# Patient Record
Sex: Female | Born: 1990 | Race: Black or African American | Hispanic: No | Marital: Single | Smoking: Current some day smoker
Health system: Southern US, Community
[De-identification: ages and names within clinical notes are randomized; demographics above are authoritative.]

## PROBLEM LIST (undated history)

## (undated) DIAGNOSIS — D759 Disease of blood and blood-forming organs, unspecified: Secondary | ICD-10-CM

---
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
## 2009-02-02 ENCOUNTER — Emergency Department: Payer: Self-pay | Admitting: Emergency Medicine

## 2010-01-10 ENCOUNTER — Emergency Department: Payer: Self-pay | Admitting: Emergency Medicine

## 2010-05-04 ENCOUNTER — Emergency Department: Payer: Self-pay | Admitting: Emergency Medicine

## 2010-11-13 ENCOUNTER — Emergency Department: Payer: Self-pay | Admitting: *Deleted

## 2010-11-14 ENCOUNTER — Observation Stay: Payer: Self-pay

## 2010-12-09 ENCOUNTER — Observation Stay: Payer: Self-pay | Admitting: Obstetrics and Gynecology

## 2011-01-10 ENCOUNTER — Emergency Department (HOSPITAL_COMMUNITY)
Admission: EM | Admit: 2011-01-10 | Discharge: 2011-01-10 | Disposition: A | Discharge status: Home / Self Care | Payer: Medicaid Other | Attending: Emergency Medicine | Admitting: Emergency Medicine

## 2011-01-10 ENCOUNTER — Encounter: Payer: Self-pay | Admitting: *Deleted

## 2011-01-10 ENCOUNTER — Emergency Department (HOSPITAL_COMMUNITY): Payer: Medicaid Other

## 2011-01-10 DIAGNOSIS — O99891 Other specified diseases and conditions complicating pregnancy: Secondary | ICD-10-CM | POA: Insufficient documentation

## 2011-01-10 DIAGNOSIS — R52 Pain, unspecified: Secondary | ICD-10-CM | POA: Insufficient documentation

## 2011-01-10 DIAGNOSIS — O0990 Supervision of high risk pregnancy, unspecified, unspecified trimester: Secondary | ICD-10-CM

## 2011-01-10 LAB — TYPE AND SCREEN
ABO/RH(D): O POS
Antibody Screen: NEGATIVE

## 2011-01-10 LAB — CBC
MCV: 95.8 fL (ref 78.0–100.0)
Platelets: 223 10*3/uL (ref 150–400)
RBC: 2.89 MIL/uL — ABNORMAL LOW (ref 3.87–5.11)
RDW: 19.5 % — ABNORMAL HIGH (ref 11.5–15.5)
WBC: 10.7 10*3/uL — ABNORMAL HIGH (ref 4.0–10.5)
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
## 2011-01-10 NOTE — ED Notes (Signed)
Placed call to [facility] for transportation to [facility]

## 2011-01-10 NOTE — ED Notes (Signed)
Pt to xray.  OB RN at bedside, pt placed on OB portable monitor.  Pt stable.

## 2011-01-10 NOTE — ED Notes (Signed)
OB RR remains at bedside.  Pt currently concerned about not having a ride home after being [facility].  Pt talked to by EDP, OB RR and myself.  Pt continues to want to leave AMA.  Aware of risks.

## 2011-01-11 LAB — ABO/RH: ABO/RH(D): O POS

## 2011-01-11 NOTE — ED Provider Notes (Signed)
I saw and evaluated the patient, reviewed the resident's note and I agree with the findings and plan.   Dione Booze, MD 01/11/11 2330

## 2011-01-12 ENCOUNTER — Encounter (HOSPITAL_COMMUNITY): Payer: Self-pay

## 2011-01-12 ENCOUNTER — Emergency Department (HOSPITAL_COMMUNITY)
Admission: EM | Admit: 2011-01-12 | Discharge: 2011-01-12 | Discharge status: Against Medical Advice | Payer: Self-pay | Attending: Emergency Medicine | Admitting: Emergency Medicine

## 2011-01-12 DIAGNOSIS — Z0389 Encounter for observation for other suspected diseases and conditions ruled out: Secondary | ICD-10-CM | POA: Insufficient documentation

## 2011-01-12 HISTORY — DX: Disease of blood and blood-forming organs, unspecified: D75.9

## 2011-01-12 NOTE — ED Notes (Signed)
Pt left after triage
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
## 2011-01-13 ENCOUNTER — Observation Stay: Payer: Self-pay | Admitting: Emergency Medicine

## 2011-04-26 ENCOUNTER — Observation Stay: Payer: Self-pay | Admitting: Obstetrics and Gynecology

## 2011-04-26 LAB — URINALYSIS, COMPLETE
Blood: NEGATIVE
Nitrite: NEGATIVE
Protein: NEGATIVE
Specific Gravity: 1.023 (ref 1.003–1.030)

## 2011-04-27 LAB — URINE CULTURE

## 2012-05-04 ENCOUNTER — Emergency Department: Payer: Self-pay | Admitting: Emergency Medicine

## 2012-05-04 LAB — CBC
HCT: 30.3 % — ABNORMAL LOW (ref 35.0–47.0)
HGB: 11.2 g/dL — ABNORMAL LOW (ref 12.0–16.0)
MCHC: 36.9 g/dL — ABNORMAL HIGH (ref 32.0–36.0)
RBC: 3.32 10*6/uL — ABNORMAL LOW (ref 3.80–5.20)
RDW: 21 % — ABNORMAL HIGH (ref 11.5–14.5)

## 2012-05-04 LAB — HCG, QUANTITATIVE, PREGNANCY: Beta Hcg, Quant.: 3666 m[IU]/mL — ABNORMAL HIGH

## 2013-09-22 ENCOUNTER — Emergency Department: Payer: Self-pay | Admitting: Emergency Medicine

## 2013-09-22 LAB — COMPREHENSIVE METABOLIC PANEL
ALBUMIN: 3.7 g/dL (ref 3.4–5.0)
ANION GAP: 15 (ref 7–16)
Alkaline Phosphatase: 48 U/L
BUN: 5 mg/dL — AB (ref 7–18)
Bilirubin,Total: 1 mg/dL (ref 0.2–1.0)
CALCIUM: 8.2 mg/dL — AB (ref 8.5–10.1)
CHLORIDE: 108 mmol/L — AB (ref 98–107)
CO2: 18 mmol/L — AB (ref 21–32)
CREATININE: 0.93 mg/dL (ref 0.60–1.30)
EGFR (African American): >60
GLUCOSE: 101 mg/dL — AB (ref 65–99)
Osmolality: 279 (ref 275–301)
Potassium: 3.5 mmol/L (ref 3.5–5.1)
SGOT(AST): 19 U/L (ref 15–37)
SGPT (ALT): 15 U/L
SODIUM: 141 mmol/L (ref 136–145)
TOTAL PROTEIN: 7.9 g/dL (ref 6.4–8.2)

## 2013-09-22 LAB — CBC WITH DIFFERENTIAL/PLATELET
Basophil #: 0 10*3/uL (ref 0.0–0.1)
Basophil %: 0.4 %
Eosinophil #: 0 10*3/uL (ref 0.0–0.7)
Eosinophil %: 0.1 %
HCT: 31.7 % — ABNORMAL LOW (ref 35.0–47.0)
HGB: 11.1 g/dL — ABNORMAL LOW (ref 12.0–16.0)
Lymphocyte #: 1.2 10*3/uL (ref 1.0–3.6)
Lymphocyte %: 10.4 %
MCH: 32.7 pg (ref 26.0–34.0)
MCHC: 34.9 g/dL (ref 32.0–36.0)
MCV: 94 fL (ref 80–100)
Monocyte #: 0.5 x10 3/mm (ref 0.2–0.9)
Monocyte %: 4.8 %
Neutrophil #: 9.5 10*3/uL — ABNORMAL HIGH (ref 1.4–6.5)
Neutrophil %: 84.3 %
Platelet: 324 10*3/uL (ref 150–440)
RBC: 3.38 10*6/uL — ABNORMAL LOW (ref 3.80–5.20)
RDW: 19.7 % — ABNORMAL HIGH (ref 11.5–14.5)
WBC: 11.3 10*3/uL — ABNORMAL HIGH (ref 3.6–11.0)

## 2013-09-22 LAB — URINALYSIS, COMPLETE
Bacteria: NONE SEEN
Bilirubin,UR: NEGATIVE
Blood: NEGATIVE
Glucose,UR: NEGATIVE mg/dL (ref 0–75)
Ketone: NEGATIVE
Leukocyte Esterase: NEGATIVE
Nitrite: NEGATIVE
Ph: 5 (ref 4.5–8.0)
Protein: NEGATIVE
RBC,UR: 2 /HPF (ref 0–5)
Specific Gravity: 1.017 (ref 1.003–1.030)
Squamous Epithelial: 1
WBC UR: 1 /HPF (ref 0–5)

## 2013-09-22 LAB — TROPONIN I: Troponin-I: <0.02 ng/mL

## 2013-09-22 LAB — LIPASE, BLOOD: Lipase: 56 U/L — ABNORMAL LOW (ref 73–393)

## 2013-12-09 ENCOUNTER — Encounter (HOSPITAL_COMMUNITY): Payer: Self-pay

## 2014-02-18 ENCOUNTER — Emergency Department: Payer: Self-pay | Admitting: Emergency Medicine

## 2014-03-23 ENCOUNTER — Emergency Department: Payer: Self-pay | Admitting: Student

## 2014-04-19 ENCOUNTER — Emergency Department: Payer: Self-pay | Admitting: Emergency Medicine

## 2014-07-11 ENCOUNTER — Emergency Department (HOSPITAL_COMMUNITY): Payer: Medicaid Other | Admitting: Anesthesiology

## 2014-07-11 ENCOUNTER — Encounter
Admission: EM | Disposition: A | Discharge status: Home / Self Care | Payer: Self-pay | Source: Home / Self Care | Attending: Emergency Medicine

## 2014-07-11 ENCOUNTER — Encounter (HOSPITAL_COMMUNITY): Payer: Self-pay | Admitting: Anesthesiology

## 2014-07-11 ENCOUNTER — Emergency Department
Admission: EM | Admit: 2014-07-11 | Discharge: 2014-07-11 | Disposition: A | Discharge status: Home / Self Care | Payer: Worker's Compensation | Attending: Emergency Medicine | Admitting: Emergency Medicine

## 2014-07-11 ENCOUNTER — Encounter: Payer: Self-pay | Admitting: Emergency Medicine

## 2014-07-11 ENCOUNTER — Encounter (HOSPITAL_COMMUNITY): Payer: Self-pay

## 2014-07-11 ENCOUNTER — Emergency Department: Payer: Worker's Compensation

## 2014-07-11 ENCOUNTER — Encounter (HOSPITAL_COMMUNITY)
Admission: EM | Disposition: A | Discharge status: Home / Self Care | Payer: Self-pay | Source: Home / Self Care | Attending: Emergency Medicine

## 2014-07-11 ENCOUNTER — Ambulatory Visit (HOSPITAL_COMMUNITY)
Admission: EM | Admit: 2014-07-11 | Discharge: 2014-07-12 | Disposition: A | Discharge status: Home / Self Care | Payer: Medicaid Other | Attending: Emergency Medicine | Admitting: Emergency Medicine

## 2014-07-11 DIAGNOSIS — S6991XA Unspecified injury of right wrist, hand and finger(s), initial encounter: Secondary | ICD-10-CM | POA: Diagnosis present

## 2014-07-11 DIAGNOSIS — Y990 Civilian activity done for income or pay: Secondary | ICD-10-CM | POA: Diagnosis not present

## 2014-07-11 DIAGNOSIS — S62501B Fracture of unspecified phalanx of right thumb, initial encounter for open fracture: Secondary | ICD-10-CM | POA: Diagnosis not present

## 2014-07-11 DIAGNOSIS — Z79899 Other long term (current) drug therapy: Secondary | ICD-10-CM | POA: Insufficient documentation

## 2014-07-11 DIAGNOSIS — F17210 Nicotine dependence, cigarettes, uncomplicated: Secondary | ICD-10-CM | POA: Insufficient documentation

## 2014-07-11 DIAGNOSIS — S61011A Laceration without foreign body of right thumb without damage to nail, initial encounter: Secondary | ICD-10-CM | POA: Diagnosis not present

## 2014-07-11 DIAGNOSIS — Y9289 Other specified places as the place of occurrence of the external cause: Secondary | ICD-10-CM | POA: Insufficient documentation

## 2014-07-11 DIAGNOSIS — S66221A Laceration of extensor muscle, fascia and tendon of right thumb at wrist and hand level, initial encounter: Secondary | ICD-10-CM | POA: Diagnosis not present

## 2014-07-11 DIAGNOSIS — IMO0002 Reserved for concepts with insufficient information to code with codable children: Secondary | ICD-10-CM

## 2014-07-11 DIAGNOSIS — S61209A Unspecified open wound of unspecified finger without damage to nail, initial encounter: Secondary | ICD-10-CM

## 2014-07-11 DIAGNOSIS — Y93G3 Activity, cooking and baking: Secondary | ICD-10-CM | POA: Insufficient documentation

## 2014-07-11 DIAGNOSIS — Z720 Tobacco use: Secondary | ICD-10-CM | POA: Diagnosis not present

## 2014-07-11 DIAGNOSIS — S56429A Laceration of extensor muscle, fascia and tendon of unspecified finger at forearm level, initial encounter: Secondary | ICD-10-CM

## 2014-07-11 DIAGNOSIS — Y93G9 Activity, other involving cooking and grilling: Secondary | ICD-10-CM | POA: Insufficient documentation

## 2014-07-11 DIAGNOSIS — W290XXA Contact with powered kitchen appliance, initial encounter: Secondary | ICD-10-CM | POA: Insufficient documentation

## 2014-07-11 DIAGNOSIS — S63621A Sprain of interphalangeal joint of right thumb, initial encounter: Secondary | ICD-10-CM | POA: Insufficient documentation

## 2014-07-11 DIAGNOSIS — Z23 Encounter for immunization: Secondary | ICD-10-CM | POA: Insufficient documentation

## 2014-07-11 DIAGNOSIS — S61219A Laceration without foreign body of unspecified finger without damage to nail, initial encounter: Secondary | ICD-10-CM

## 2014-07-11 HISTORY — PX: I & D EXTREMITY: SHX5045

## 2014-07-11 SURGERY — IRRIGATION AND DEBRIDEMENT EXTREMITY
Anesthesia: General | Site: Thumb | Laterality: Right

## 2014-07-11 SURGERY — IRRIGATION AND DEBRIDEMENT EXTREMITY
Anesthesia: General | Laterality: Right

## 2014-07-11 MED ORDER — FENTANYL CITRATE (PF) 250 MCG/5ML IJ SOLN
INTRAMUSCULAR | Status: AC
  Filled 2014-07-11: qty 5

## 2014-07-11 MED ORDER — TETANUS-DIPHTH-ACELL PERTUSSIS 5-2.5-18.5 LF-MCG/0.5 IM SUSP
INTRAMUSCULAR | Status: AC
  Administered 2014-07-11: 0.5 mL via INTRAMUSCULAR
  Filled 2014-07-11: qty 0.5

## 2014-07-11 MED ORDER — TETANUS-DIPHTH-ACELL PERTUSSIS 5-2.5-18.5 LF-MCG/0.5 IM SUSP
0.5000 mL | Freq: Once | INTRAMUSCULAR | Status: AC
  Administered 2014-07-11: 0.5 mL via INTRAMUSCULAR

## 2014-07-11 MED ORDER — TETANUS-DIPHTHERIA TOXOIDS TD 5-2 LFU IM INJ: 0.5000 mL | INJECTION | Freq: Once | INTRAMUSCULAR | Status: DC

## 2014-07-11 MED ORDER — HYDROMORPHONE HCL 1 MG/ML IJ SOLN
INTRAMUSCULAR | Status: AC
  Filled 2014-07-11: qty 1

## 2014-07-11 MED ORDER — ONDANSETRON HCL 4 MG/2ML IJ SOLN: 4.0000 mg | Freq: Once | INTRAMUSCULAR | Status: DC

## 2014-07-11 MED ORDER — LIDOCAINE HCL (CARDIAC) 20 MG/ML IV SOLN
INTRAVENOUS | Status: AC
  Filled 2014-07-11: qty 5

## 2014-07-11 MED ORDER — TETANUS-DIPHTH-ACELL PERTUSSIS 5-2.5-18.5 LF-MCG/0.5 IM SUSP
INTRAMUSCULAR | Status: AC
  Filled 2014-07-11: qty 0.5

## 2014-07-11 MED ORDER — MORPHINE SULFATE 4 MG/ML IJ SOLN: 4.0000 mg | Freq: Once | INTRAMUSCULAR | Status: DC

## 2014-07-11 MED ORDER — HYDROMORPHONE HCL 1 MG/ML IJ SOLN
1.0000 mg | Freq: Once | INTRAMUSCULAR | Status: AC
  Administered 2014-07-11: 1 mg via INTRAMUSCULAR

## 2014-07-11 MED ORDER — MIDAZOLAM HCL 2 MG/2ML IJ SOLN
INTRAMUSCULAR | Status: AC
  Filled 2014-07-11: qty 2

## 2014-07-11 MED ORDER — HYDROMORPHONE HCL 1 MG/ML IJ SOLN
INTRAMUSCULAR | Status: AC
  Administered 2014-07-11: 1 mg via INTRAVENOUS
  Filled 2014-07-11: qty 1

## 2014-07-11 MED ORDER — HYDROMORPHONE HCL 1 MG/ML IJ SOLN
1.0000 mg | Freq: Once | INTRAMUSCULAR | Status: DC
  Administered 2014-07-11: 1 mg via INTRAVENOUS

## 2014-07-11 MED ORDER — CEFAZOLIN SODIUM-DEXTROSE 2-3 GM-% IV SOLR
2.0000 g | INTRAVENOUS | Status: DC
  Filled 2014-07-11: qty 50

## 2014-07-11 MED ORDER — BUPIVACAINE HCL (PF) 0.25 % IJ SOLN
INTRAMUSCULAR | Status: AC
  Filled 2014-07-11: qty 30

## 2014-07-11 MED ORDER — PROPOFOL 10 MG/ML IV BOLUS
INTRAVENOUS | Status: AC
  Filled 2014-07-11: qty 20

## 2014-07-11 MED ORDER — LIDOCAINE HCL (PF) 1 % IJ SOLN
INTRAMUSCULAR | Status: AC
  Filled 2014-07-11: qty 10

## 2014-07-11 SURGICAL SUPPLY — 45 items
BAG DECANTER FOR FLEXI CONT (MISCELLANEOUS) ×3 IMPLANT
BANDAGE ELASTIC 3 VELCRO ST LF (GAUZE/BANDAGES/DRESSINGS) IMPLANT
BANDAGE ELASTIC 4 VELCRO ST LF (GAUZE/BANDAGES/DRESSINGS) IMPLANT
BNDG COHESIVE 1X5 TAN STRL LF (GAUZE/BANDAGES/DRESSINGS) ×3 IMPLANT
BNDG CONFORM 2 STRL LF (GAUZE/BANDAGES/DRESSINGS) ×3 IMPLANT
BNDG ESMARK 4X9 LF (GAUZE/BANDAGES/DRESSINGS) ×3 IMPLANT
BNDG GAUZE ELAST 4 BULKY (GAUZE/BANDAGES/DRESSINGS) IMPLANT
CORDS BIPOLAR (ELECTRODE) IMPLANT
CUFF TOURNIQUET SINGLE 18IN (TOURNIQUET CUFF) IMPLANT
DRAPE SURG 17X23 STRL (DRAPES) ×3 IMPLANT
ELECT REM PT RETURN 9FT ADLT (ELECTROSURGICAL)
ELECTRODE REM PT RTRN 9FT ADLT (ELECTROSURGICAL) IMPLANT
GAUZE PACKING IODOFORM 1/4X5 (PACKING) IMPLANT
GAUZE SPONGE 4X4 12PLY STRL (GAUZE/BANDAGES/DRESSINGS) ×3 IMPLANT
GAUZE XEROFORM 1X8 LF (GAUZE/BANDAGES/DRESSINGS) ×3 IMPLANT
GLOVE BIOGEL M STRL SZ7.5 (GLOVE) ×3 IMPLANT
GOWN STRL REUS W/ TWL LRG LVL3 (GOWN DISPOSABLE) ×2 IMPLANT
GOWN STRL REUS W/TWL LRG LVL3 (GOWN DISPOSABLE) ×4
HANDPIECE INTERPULSE COAX TIP (DISPOSABLE)
KIT BASIN OR (CUSTOM PROCEDURE TRAY) ×3 IMPLANT
KIT ROOM TURNOVER OR (KITS) ×3 IMPLANT
MANIFOLD NEPTUNE II (INSTRUMENTS) ×3 IMPLANT
NEEDLE HYPO 25GX1X1/2 BEV (NEEDLE) IMPLANT
NS IRRIG 1000ML POUR BTL (IV SOLUTION) ×3 IMPLANT
PACK ORTHO EXTREMITY (CUSTOM PROCEDURE TRAY) ×3 IMPLANT
PAD ARMBOARD 7.5X6 YLW CONV (MISCELLANEOUS) ×6 IMPLANT
PAD CAST 4YDX4 CTTN HI CHSV (CAST SUPPLIES) IMPLANT
PADDING CAST COTTON 4X4 STRL (CAST SUPPLIES)
SET HNDPC FAN SPRY TIP SCT (DISPOSABLE) IMPLANT
SOAP 2 % CHG 4 OZ (WOUND CARE) ×3 IMPLANT
SPLINT FINGER W/BULB (SOFTGOODS) ×3 IMPLANT
SPONGE GAUZE 4X4 12PLY STER LF (GAUZE/BANDAGES/DRESSINGS) ×3 IMPLANT
SPONGE LAP 18X18 X RAY DECT (DISPOSABLE) IMPLANT
SPONGE LAP 4X18 X RAY DECT (DISPOSABLE) ×3 IMPLANT
SUT ETHILON 5 0 PS 2 18 (SUTURE) ×3 IMPLANT
SUT FIBERWIRE 4-0 18 TAPR NDL (SUTURE) ×3
SUTURE FIBERWR 4-0 18 TAPR NDL (SUTURE) ×1 IMPLANT
SYR CONTROL 10ML LL (SYRINGE) IMPLANT
TOWEL OR 17X24 6PK STRL BLUE (TOWEL DISPOSABLE) ×3 IMPLANT
TOWEL OR 17X26 10 PK STRL BLUE (TOWEL DISPOSABLE) ×3 IMPLANT
TUBE ANAEROBIC SPECIMEN COL (MISCELLANEOUS) IMPLANT
TUBE CONNECTING 12'X1/4 (SUCTIONS) ×1
TUBE CONNECTING 12X1/4 (SUCTIONS) ×2 IMPLANT
WATER STERILE IRR 1000ML POUR (IV SOLUTION) ×3 IMPLANT
YANKAUER SUCT BULB TIP NO VENT (SUCTIONS) ×3 IMPLANT

## 2014-07-11 NOTE — ED Notes (Signed)
Pt to short stay for surgery. Rose, MD at bedside to assume care. Report given.

## 2014-07-11 NOTE — ED Notes (Signed)
Pt complaining of laceration to R thumb. 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

## 2014-07-11 NOTE — Discharge Instructions (Signed)
Laceration Care, Adult A laceration is a cut that goes through all layers of the skin. The cut goes into the tissue beneath the skin. HOME CARE For stitches (sutures) or staples:  Keep the cut clean and dry.  If you have a bandage (dressing), change it at least once a day. Change the bandage if it gets wet or dirty, or as told by your doctor.  Wash the cut with soap and water 2 times a day. Rinse the cut with water. Pat it dry with a clean towel.  Put a thin layer of medicated cream on the cut as told by your doctor.  You may shower after the first 24 hours. Do not soak the cut in water until the stitches are removed.  Only take medicines as told by your doctor.  Have your stitches or staples removed as told by your doctor. For skin adhesive strips:  Keep the cut clean and dry.  Do not get the strips wet. You may take a bath, but be careful to keep the cut dry.  If the cut gets wet, pat it dry with a clean towel.  The strips will fall off on their own. Do not remove the strips that are still stuck to the cut. For wound glue:  You may shower or take baths. Do not soak or scrub the cut. Do not swim. Avoid heavy sweating until the glue falls off on its own. After a shower or bath, pat the cut dry with a clean towel.  Do not put medicine on your cut until the glue falls off.  If you have a bandage, do not put tape over the glue.  Avoid lots of sunlight or tanning lamps until the glue falls off. Put sunscreen on the cut for the first year to reduce your scar.  The glue will fall off on its own. Do not pick at the glue. You may need a tetanus shot if:  You cannot remember when you had your last tetanus shot.  You have never had a tetanus shot. If you need a tetanus shot and you choose not to have one, you may get tetanus. Sickness from tetanus can be serious. GET HELP RIGHT AWAY IF:   Your pain does not get better with medicine.  Your arm, hand, leg, or foot loses feeling  (numbness) or changes color.  Your cut is bleeding.  Your joint feels weak, or you cannot use your joint.  You have painful lumps on your body.  Your cut is red, puffy (swollen), or painful.  You have a red line on the skin near the cut.  You have yellowish-white fluid (pus) coming from the cut.  You have a fever.  You have a bad smell coming from the cut or bandage.  Your cut breaks open before or after stitches are removed.  You notice something coming out of the cut, such as wood or glass.  You cannot move a finger or toe. MAKE SURE YOU:   Understand these instructions.  Will watch your condition.  Will get help right away if you are not doing well or get worse. Document Released: 07/13/2007 Document Revised: 04/18/2011 Document Reviewed: 07/20/2010 [facility] Patient Information 2015 [place] [employer] [place]. This information is not intended to replace advice given to you by your health care provider. Make sure you discuss any questions you have with your health care provider.   [facility]. At the triage desk, let them know Dr. Izora Ribasoley is  expecting you for surgery tonight, you are arriving via private transportation from [facility]. Do not eat or drink until advised by Dr. Izora Ribas

## 2014-07-11 NOTE — ED Provider Notes (Signed)
CSN: [id number]     Arrival date & time 07/11/14  2226 History   First MD Initiated Contact with Patient 07/11/14 2242     Chief Complaint  Patient presents with  . Laceration     (Consider location/radiation/quality/duration/timing/severity/associated sxs/prior Treatment) Patient is a 24 y.o. female presenting with skin laceration. The history is provided by the patient. No language interpreter was used.  Laceration Location:  Finger Finger laceration location:  R thumb Length (cm):  4 Depth:  Through underlying tissue Bleeding: controlled   Time since incident:  7 hours Laceration mechanism:  Metal edge   Past Medical History  Diagnosis Date  . Blood disease    History reviewed. No pertinent past surgical history. History reviewed. No pertinent family history. History  Substance Use Topics  . Smoking status: Current Some Day Smoker -- 0.50 packs/day  . Smokeless tobacco: Not on file  . Alcohol Use: No   OB History    Gravida Para Term Preterm AB TAB SAB Ectopic Multiple Living   1              Review of Systems  Constitutional: Negative for fever and chills.  Respiratory: Negative for shortness of breath.   Cardiovascular: Negative for chest pain.  Gastrointestinal: Negative for nausea, vomiting, diarrhea and constipation.  Genitourinary: Negative for dysuria.  Skin: Positive for wound.      Allergies  Review of patient's allergies indicates no known allergies.  Home Medications   Prior to Admission medications   Medication Sig Start Date End Date Taking? Authorizing Provider  acetaminophen (TYLENOL) 500 MG tablet Take 1,000 mg by mouth every 6 (six) hours as needed. For pain    Yes Historical Provider, MD   There were no vitals taken for this visit. Physical Exam  Constitutional: She is oriented to person, place, and time. She appears well-developed and well-nourished.  HENT:  Head: Normocephalic and atraumatic.  Eyes: Conjunctivae and EOM are normal.   Neck: Normal range of motion.  Cardiovascular: Normal rate.   Pulmonary/Chest: Effort normal.  Abdominal: She exhibits no distension.  Musculoskeletal: Normal range of motion.  Unable to range right thumb, dressing intact, bleeding controlled  Neurological: She is alert and oriented to person, place, and time.  Skin: Skin is dry.  Psychiatric: She has a normal mood and affect. Her behavior is normal. Judgment and thought content normal.  Nursing note and vitals reviewed.   ED Course  Procedures (including critical care time) Labs Review Labs Reviewed - No data to display  Imaging Review Dg Finger Thumb Right  07/11/2014   CLINICAL DATA:  Laceration at work today.  EXAM: RIGHT THUMB 2+V  COMPARISON:  09/22/2013  FINDINGS: There is a remote avulsion fracture at the attachment region of the ulnar collateral ligament on the base of the proximal phalanx. There are volar fracture fragments versus radiopaque foreign bodies associated with the distal phalanx.  IMPRESSION: Volar fracture fragments versus radiopaque foreign bodies located along the volar aspect of the distal phalanx.   Electronically Signed   By: Rudie Meyer M.D.   On: 07/11/2014 19:08     EKG Interpretation None      MDM   Final diagnoses:  Laceration    Pt. Transferred to [facility] to be seen by Dr. Izora Ribas.    Dr. Izora Ribas to take pt. To OR.  Patient's pain is 10/10, I will treat pain.  Will leave dressing intact until Dr. Debby Bud arrival.   Roxy Horseman, PA-C 07/11/14 2315  Richardean Canalavid H Yao, MD 07/11/14 [phone]

## 2014-07-11 NOTE — H&P (Signed)
Reason for Consult:thumb injury Referring Physician: ER  CC:I cut my thumb  HPI:  Kristen Dodson is an 24 y.o. 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 Open joint, tendon injury, referred here .   Pain is rated at  10  /10 and is described as sharp.  Pain is constant.  Pain is made better by rest/immobilization, worse with motion.   Associated signs/symptoms:altered sensation ulnar side of thumb, unable to flex/extend thumb Previous treatment:    Past Medical History  Diagnosis Date  . Blood disease     History reviewed. No pertinent past surgical history.  No family history on file.  Social History:  reports that she has been smoking.  She does not have any smokeless tobacco history on file. She reports that she does not drink alcohol or use illicit drugs.  Allergies: No Known Allergies  Medications: I have reviewed the patient's current medications.  No results found for this or any previous visit (from the past 48 hour(s)).  Dg Finger Thumb Right  07/11/2014   CLINICAL DATA:  Laceration at work today.  EXAM: RIGHT THUMB 2+V  COMPARISON:  09/22/2013  FINDINGS: There is a remote avulsion fracture at the attachment region of the ulnar collateral ligament on the base of the proximal phalanx. There are volar fracture fragments versus radiopaque foreign bodies associated with the distal phalanx.  IMPRESSION: Volar fracture fragments versus radiopaque foreign bodies located along the volar aspect of the distal phalanx.   Electronically Signed   By: Rudie MeyerP.  Gallerani M.D.   On: 07/11/2014 19:08    Pertinent items are noted in HPI. Temp:  [98.2 F (36.8 C)-98.7 F (37.1 C)] 98.2 F (36.8 C) (06/03 2111) Pulse Rate:  [81-125] 81 (06/03 2111) Resp:  [18-20] 18 (06/03 2111) BP: (100-110)/(68) 110/68 mmHg (06/03 2111) SpO2:  [100 %] 100 % (06/03 2111) Weight:  [72.576 kg (160 lb)] 72.576 kg (160 lb)  (06/03 1622) General appearance: alert and cooperative Resp: clear to auscultation bilaterally Cardio: regular rate and rhythm GI: soft, non-tender; bowel sounds normal; no masses,  no organomegaly Extremities: extremities normal, atraumatic, no cyanosis or edema   Except for R thumb with laceration on ulnar side extending from volar to dorsal, distal phalanx deviated radially, altered sensation ulnarly, unable to flex, extend distal phalanx, tip pink with good cap refill   Assessment: Complex R thumb laceration with probable tendon, nerve injury Plan: Will explore and repair I have discussed this treatment plan in detail with patient and family, including the risks of the recommended treatment or surgery, the benefits and the alternatives, including bleeding, infection, loss of function, loss of sensation.  The patient and caregiver understands that additional treatment may be necessary.  [name] CHRISTOPHER 07/11/2014, 11:21 PM

## 2014-07-11 NOTE — ED Provider Notes (Signed)
CSN: [id number]     Arrival date & time 07/11/14  1612 History   First MD Initiated Contact with Patient 07/11/14 1722     Chief Complaint  Patient presents with  . 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 Patient presented to the emergency department with a laceration to the ulnar aspect of her right thumb at the interphalangeal joint. Patient's pain is severe. Her tetanus is not up-to-date. She denies any other injuries to her body. Denies any numbness or tingling. She has not had anything for pain. Last meal was at 3 PM.  Past Medical History  Diagnosis Date  . Blood disease    History reviewed. No pertinent past surgical history. No family history on file. History  Substance Use Topics  . Smoking status: Current Some Day Smoker -- 0.50 packs/day  . Smokeless tobacco: Not on file  . Alcohol Use: No   OB History    Gravida Para Term Preterm AB TAB SAB Ectopic Multiple Living   1              Review of Systems  Constitutional: Negative for fever, chills, activity change and fatigue.  HENT: Negative for congestion, sinus pressure and sore throat.   Eyes: Negative for visual disturbance.  Respiratory: Negative for cough, chest tightness and shortness of breath.   Cardiovascular: Negative for chest pain and leg swelling.  Gastrointestinal: Negative for nausea, vomiting, abdominal pain and diarrhea.  Genitourinary: Negative for dysuria.  Musculoskeletal: Positive for joint swelling ( Right thumb IP joint). Negative for arthralgias and gait problem.  Skin: Positive for wound ( right thumb laceration). Negative for rash.  Neurological: Negative for weakness, numbness and headaches.  Hematological: Negative for adenopathy.  Psychiatric/Behavioral: Negative for  behavioral problems, confusion and agitation.      Allergies  Review of patient's allergies indicates no known allergies.  Home Medications   Prior to Admission medications   Medication Sig Start Date End Date Taking? Authorizing Provider  acetaminophen (TYLENOL) 500 MG tablet Take 1,000 mg by mouth every 6 (six) hours as needed. For pain     Historical Provider, MD  ferrous sulfate 325 (65 FE) MG tablet Take 325 mg by mouth daily with breakfast.      Historical Provider, MD   BP 100/68 mmHg  Pulse 125  Temp(Src) 98.7 F (37.1 C) (Oral)  Resp 20  Ht 5\' 5"  (1.651 m)  Wt 160 lb (72.576 kg)  BMI 26.63 kg/m2  SpO2 100% Physical Exam  Constitutional: She is oriented to person, place, and time. She appears well-developed and well-nourished. No distress.  HENT:  Head: Normocephalic and atraumatic.  Mouth/Throat: Oropharynx is clear and moist.  Eyes: EOM are normal. Right eye exhibits no discharge. Left eye exhibits no discharge.  Neck: Normal range of motion. Neck supple.  Cardiovascular: Normal rate, regular rhythm and intact distal pulses.   Pulmonary/Chest: Effort normal and breath sounds normal. No respiratory distress. She exhibits no tenderness.  Musculoskeletal:       Right hand: She exhibits decreased range of motion (right thumb interphalangeal joint), tenderness and bony tenderness.       Hands: Open laceration to the ulnar aspect of the interphalangeal joint of the right thumb. The joint was visualized. No foreign body seen. Patient is unable to actively extend the interphalangeal joint. There  is significant laxity of the interphalangeal joint on her ulnar collateral ligament. Sensation is intact along the medial lateral aspects of the thumb.   Neurological: She is alert and oriented to person, place, and time. She has normal reflexes.  Skin: Skin is warm and dry.  Psychiatric: She has a normal mood and affect. Her behavior is normal. Thought content normal.    ED Course   Procedures (including critical care time) LACERATION REPAIR Performed by: Patience Musca Authorized by: Patience Musca Consent: Verbal consent obtained. Risks and benefits: risks, benefits and alternatives were discussed Consent given by: patient Patient identity confirmed: provided demographic data Prepped and Draped in normal sterile fashion Wound explored  Laceration Locationright thumb Laceration Length: 4 cm No Foreign Bodies seen or palpated  Anesthesia: right thumb digital block  anesthetic: lidoca1% without epi  Irrigation method: syringe Amount of cleaning: standard  Skin closure: 4-0 nylon Number of sutures: 1 simple interrupted   Patient tolerance: Patient tolerated the procedure well with no immediate complications. Labs Review Labs Reviewed - No data to display  Imaging Review Dg Finger Thumb Right  07/11/2014   CLINICAL DATA:  Laceration at work today.  EXAM: RIGHT THUMB 2+V  COMPARISON:  09/22/2013  FINDINGS: There is a remote avulsion fracture at the attachment region of the ulnar collateral ligament on the base of the proximal phalanx. There are volar fracture fragments versus radiopaque foreign bodies associated with the distal phalanx.  IMPRESSION: Volar fracture fragments versus radiopaque foreign bodies located along the volar aspect of the distal phalanx.   Electronically Signed   By: Rudie Meyer M.D.   On: 07/11/2014 19:08     EKG Interpretation None      MDM   Final diagnoses:  Finger laceration involving tendon, initial encounter  Extensor tendon laceration of finger with open wound, initial encounter  Avulsion fracture of thumb, right, open, initial encounter    After discussing case with orthopedist on-call [facility], it was determined the patient would benefit from hand surgeon. [place] hand surgeon on call was paged, case was discussed with Dr. Izora Ribas. Dr. Izora Ribas requested the patient arrived at [facility] emergency  department via private transportation for surgery tonight. Patient and patient's mother are okay with private transportation. Patient is medically stable for transport.     Evon Slack, PA-C 07/11/14 2047  Darien Ramus, MD 07/12/14 [phone]

## 2014-07-11 NOTE — ED Notes (Signed)
Patient presents to the ED with right thumb in a bucket of ice.  Patient is complaining of severe pain and thumb appears deformed.  Thumb is bleeding slightly, bleeding is controlled.  Patient is crying and hyperventilating at this time.

## 2014-07-11 NOTE — ED Notes (Signed)
Pt in severe pain and will not allow examination of hand or arm. Dilaudid prescribed after verifying 3 times with patient that she is NOT PREGNANT.

## 2014-07-11 NOTE — ED Notes (Signed)
[facility] after verbalizing understanding of discharge instructions; nad noted.

## 2014-07-11 NOTE — Anesthesia Preprocedure Evaluation (Addendum)
Anesthesia Evaluation  Patient identified by MRN, date of birth, ID band Patient awake    Reviewed: Allergy & Precautions, NPO status , Patient's Chart, lab work & pertinent test results  Airway Mallampati: II  TM Distance: >3 FB Neck ROM: Full    Dental no notable dental hx.    Pulmonary Current Smoker,  breath sounds clear to auscultation  Pulmonary exam normal       Cardiovascular negative cardio ROS Normal cardiovascular examRhythm:Regular Rate:Normal     Neuro/Psych negative neurological ROS  negative psych ROS   GI/Hepatic negative GI ROS, Neg liver ROS,   Endo/Other  negative endocrine ROS  Renal/GU negative Renal ROS  negative genitourinary   Musculoskeletal negative musculoskeletal ROS (+)   Abdominal   Peds negative pediatric ROS (+)  Hematology negative hematology ROS (+)   Anesthesia Other Findings   Reproductive/Obstetrics negative OB ROS                            Anesthesia Physical Anesthesia Plan  ASA: II  Anesthesia Plan: General   Post-op Pain Management:    Induction: Intravenous and Rapid sequence  Airway Management Planned: Oral ETT  Additional Equipment:   Intra-op Plan:   Post-operative Plan: Extubation in OR  Informed Consent: I have reviewed the patients History and Physical, chart, labs and discussed the procedure including the risks, benefits and alternatives for the proposed anesthesia with the patient or authorized representative who has indicated his/her understanding and acceptance.   Dental advisory given  Plan Discussed with: CRNA and Surgeon  Anesthesia Plan Comments: (Full meal at 1500)       Anesthesia Quick Evaluation

## 2014-07-11 NOTE — Anesthesia Preprocedure Evaluation (Deleted)
Anesthesia Evaluation  Patient identified by MRN, date of birth, ID band Patient awake    Reviewed: Allergy & Precautions, NPO status , Patient's Chart, lab work & pertinent test results  Airway Mallampati: II  TM Distance: >3 FB Neck ROM: Full    Dental no notable dental hx.    Pulmonary Current Smoker,  breath sounds clear to auscultation  Pulmonary exam normal       Cardiovascular negative cardio ROS Normal cardiovascular examRhythm:Regular Rate:Tachycardia     Neuro/Psych negative neurological ROS  negative psych ROS   GI/Hepatic negative GI ROS, Neg liver ROS,   Endo/Other  negative endocrine ROS  Renal/GU negative Renal ROS  negative genitourinary   Musculoskeletal negative musculoskeletal ROS (+)   Abdominal   Peds negative pediatric ROS (+)  Hematology negative hematology ROS (+)   Anesthesia Other Findings   Reproductive/Obstetrics negative OB ROS                             Anesthesia Physical Anesthesia Plan  ASA: II and emergent  Anesthesia Plan: General   Post-op Pain Management:    Induction: Intravenous and Rapid sequence  Airway Management Planned: Oral ETT  Additional Equipment:   Intra-op Plan:   Post-operative Plan: Extubation in OR  Informed Consent: I have reviewed the patients History and Physical, chart, labs and discussed the procedure including the risks, benefits and alternatives for the proposed anesthesia with the patient or authorized representative who has indicated his/her understanding and acceptance.   Dental advisory given  Plan Discussed with: CRNA and Surgeon  Anesthesia Plan Comments:         Anesthesia Quick Evaluation

## 2014-07-12 MED ORDER — HYDROMORPHONE HCL 1 MG/ML IJ SOLN
INTRAMUSCULAR | Status: AC
  Filled 2014-07-12: qty 1

## 2014-07-12 MED ORDER — SUCCINYLCHOLINE CHLORIDE 20 MG/ML IJ SOLN
INTRAMUSCULAR | Status: DC | PRN
Start: 2014-07-11 — End: 2014-07-12
  Administered 2014-07-11: 120 mg via INTRAVENOUS

## 2014-07-12 MED ORDER — MIDAZOLAM HCL 2 MG/2ML IJ SOLN
INTRAMUSCULAR | Status: DC | PRN
  Administered 2014-07-11: 2 mg via INTRAVENOUS

## 2014-07-12 MED ORDER — ONDANSETRON HCL 4 MG/2ML IJ SOLN
INTRAMUSCULAR | Status: DC | PRN
  Administered 2014-07-12: 4 mg via INTRAVENOUS

## 2014-07-12 MED ORDER — KETOROLAC TROMETHAMINE 30 MG/ML IJ SOLN
INTRAMUSCULAR | Status: AC
  Filled 2014-07-12: qty 1

## 2014-07-12 MED ORDER — LIDOCAINE HCL (CARDIAC) 20 MG/ML IV SOLN
INTRAVENOUS | Status: DC | PRN
  Administered 2014-07-11: 50 mg via INTRAVENOUS

## 2014-07-12 MED ORDER — HYDROMORPHONE HCL 1 MG/ML IJ SOLN
0.2500 mg | INTRAMUSCULAR | Status: DC | PRN
  Administered 2014-07-12 (×2): 0.5 mg via INTRAVENOUS

## 2014-07-12 MED ORDER — PROMETHAZINE HCL 25 MG/ML IJ SOLN
6.2500 mg | INTRAMUSCULAR | Status: DC | PRN
Start: 2014-07-12 — End: 2014-07-12

## 2014-07-12 MED ORDER — KETOROLAC TROMETHAMINE 30 MG/ML IJ SOLN
30.0000 mg | Freq: Once | INTRAMUSCULAR | Status: DC | PRN
Start: 2014-07-12 — End: 2014-07-12

## 2014-07-12 MED ORDER — HYDROMORPHONE HCL 1 MG/ML IJ SOLN: 0.2500 mg | INTRAMUSCULAR | Status: DC | PRN

## 2014-07-12 MED ORDER — SODIUM CHLORIDE 0.9 % IR SOLN
Status: DC | PRN
  Administered 2014-07-12: 1

## 2014-07-12 MED ORDER — PROPOFOL 10 MG/ML IV BOLUS
INTRAVENOUS | Status: DC | PRN
  Administered 2014-07-11: 160 mg via INTRAVENOUS
  Administered 2014-07-12: 40 mg via INTRAVENOUS

## 2014-07-12 MED ORDER — KETOROLAC TROMETHAMINE 30 MG/ML IJ SOLN
30.0000 mg | Freq: Once | INTRAMUSCULAR | Status: AC | PRN
  Administered 2014-07-12: 30 mg via INTRAVENOUS

## 2014-07-12 MED ORDER — CEFAZOLIN SODIUM-DEXTROSE 2-3 GM-% IV SOLR
INTRAVENOUS | Status: DC | PRN
  Administered 2014-07-12: 2 g via INTRAVENOUS

## 2014-07-12 MED ORDER — PROMETHAZINE HCL 25 MG/ML IJ SOLN: 6.2500 mg | INTRAMUSCULAR | Status: DC | PRN

## 2014-07-12 MED ORDER — DEXAMETHASONE SODIUM PHOSPHATE 10 MG/ML IJ SOLN
INTRAMUSCULAR | Status: DC | PRN
  Administered 2014-07-12: 4 mg via INTRAVENOUS

## 2014-07-12 MED ORDER — LACTATED RINGERS IV SOLN
INTRAVENOUS | Status: DC | PRN
  Administered 2014-07-11: via INTRAVENOUS

## 2014-07-12 MED ORDER — FENTANYL CITRATE (PF) 250 MCG/5ML IJ SOLN
INTRAMUSCULAR | Status: DC | PRN
  Administered 2014-07-11: 50 ug via INTRAVENOUS
  Administered 2014-07-12: 100 ug via INTRAVENOUS
  Administered 2014-07-12 (×2): 50 ug via INTRAVENOUS

## 2014-07-12 MED ORDER — BUPIVACAINE HCL (PF) 0.25 % IJ SOLN
INTRAMUSCULAR | Status: DC | PRN
  Administered 2014-07-12: 10 mL

## 2014-07-12 NOTE — Op Note (Signed)
NAMDelphia Dodson:  Dodson, Kristen              ACCOUNT NO.:  [id number]  MEDICAL RECORD NO.:  [id number]  LOCATION:  [facility]                         FACILITY:  [facility]  PHYSICIAN:  Johnette AbrahamHarrill C Dasie Chancellor, MD    DATE OF BIRTH:  1991/01/06  DATE OF PROCEDURE:  07/11/2014 DATE OF DISCHARGE:  07/12/2014                              OPERATIVE REPORT   DATE OF SURGERY:  July 11, 2014/July 12, 2014.  PREOPERATIVE DIAGNOSIS:  Complex laceration to the right thumb with probable collateral ligaments and tendon injury.  POSTOPERATIVE DIAGNOSIS:  Complex laceration to the right thumb with probable collateral ligaments and tendon injury.  PROCEDURES: 1. Exploration of complex wound to the right thumb distal     interphalangeal joint, washout with removal of bone foreign body. 2. Repair of ulnar collateral ligament. 3. Repair of extensor tendon. 4. Closure of laceration, 3.5 cm.  INDICATIONS:  Ms. Kristen Dodson is a 24 year old female who lacerated her thumb in some kind of food processing machine at work this afternoon.  She presented recently to [facility].  I was contacted for definitive care.  [facility].  Risks, benefits, and alternatives of surgery were thoroughly discussed with her and her significant other or family including most common risks associated with the procedure as well as followup.  DESCRIPTION OF PROCEDURE:  The patient was taken to the operating room, placed supine on the operating room table.  General anesthesia was administered without difficulty.  The right upper extremity was prepped and draped in the normal sterile fashion.  An Esmarch was used to exsanguinate the arm and tourniquet was inflated to 250 mmHg. Initially, the wound was explored.  The ulnar collateral ligament was completely transected and the DIP joint was opened.  There was obviously full-thickness extensor tendon laceration.  The flexor tendons seemed intact.  Thorough irrigation of the  DIP joint and soft tissue surrounding the rim were then performed.  There were 2 small foreign bodies in the joint that were removed.  The joint was then relocated. There was a small and both proximally and distally of the collateral ligament that was available for suture repair.  4-0 FiberWire was used to repair the sleeve and nicely reducing the distal phalanx.  Additional 4-0 FiberWire was used to repair the extensor tendon.  Afterwards, gentle range of motion of the thumb revealed a new gaping of the tendon repair.  Following, the skin was closed with interrupted 5-0 nylon sutures.  Sterile dressing and splint were used.  The tourniquet was released.  The finger was pink at the conclusion.  Marcaine was infiltrated around the base of the thumb for postoperative pain control, and the patient was sent to recovery stable.     Johnette AbrahamHarrill C Quadarius Henton, MD     HCC/MEDQ  D:  07/12/2014  T:  07/12/2014  Job:  [id number]

## 2014-07-12 NOTE — Discharge Instructions (Signed)
Discharge Instructions:  Keep your dressing clean, dry and in place until instructed to remove by Dr. Racquel Arkin.  If the dressing becomes dirty or wet call the office for instructions during business hours. Elevate the extremity to help with swelling, this will also help with any discomfort. Take your medication as prescribed. No lifting with the injured  extremity. If you feel that the dressing is too tight, you may loosen it, but keep it on; finger tips should be pink; if there is a concern, call the office. [phone] Ice may be used if the injury is a fracture, do not apply ice directly to the skin. Please call the office on the next business day after discharge to arrange a follow up appointment.  Call [phone] between the hours of 9am - 5pm M-Th or 9am - 1pm on Fri. For most hand injuries and/or conditions, you may return to work using the uninjured hand (one handed duty) within 24-72 hours.  A detailed note will be provided to you at your follow up appointment or may contact the office prior to your follow up.    

## 2014-07-12 NOTE — Anesthesia Procedure Notes (Signed)
Procedure Name: Intubation Date/Time: 07/11/2014 11:54 PM Performed by: Molli HazardGORDON, Izzak Fries M Pre-anesthesia Checklist: Patient identified, Emergency Drugs available, Suction available and Patient being monitored Patient Re-evaluated:Patient Re-evaluated prior to inductionOxygen Delivery Method: Circle system utilized Preoxygenation: Pre-oxygenation with 100% oxygen Intubation Type: IV induction, Rapid sequence and Cricoid Pressure applied Laryngoscope Size: Miller and 2 Grade View: Grade I Tube type: Oral Tube size: 7.0 mm Number of attempts: 1 Airway Equipment and Method: Stylet Placement Confirmation: ETT inserted through vocal cords under direct vision Secured at: 20 cm Tube secured with: Tape Dental Injury: Teeth and Oropharynx as per pre-operative assessment

## 2014-07-12 NOTE — Transfer of Care (Signed)
Immediate Anesthesia Transfer of Care Note  Patient: Kristen GulaWhitney B Dodson  Procedure(s) Performed: Procedure(s): REPAIR COMPLEX LACERATION  TO THUMB, repair ulnar collateral ligament, repair extensor tendon , joint washout (Right)  Patient Location: PACU  Anesthesia Type:General  Level of Consciousness: awake, alert  and oriented  Airway & Oxygen Therapy: Patient connected to nasal cannula oxygen  Post-op Assessment: Report given to RN and Post -op Vital signs reviewed and stable  Post vital signs: Reviewed and stable  Last Vitals:  Filed Vitals:   07/12/14 0050  BP: 135/71  Pulse: 117  Temp:   Resp: 22    Complications: No apparent anesthesia complications

## 2014-07-12 NOTE — Progress Notes (Signed)
Spoke to Dr. Okey Dupreose about pts stated pain level of 10/10 but patient falling asleep.  MD ok to send pt home.

## 2014-07-13 NOTE — Anesthesia Postprocedure Evaluation (Signed)
  Anesthesia Post-op Note  Patient: Kristen Dodson  Procedure(s) Performed: Procedure(s) (LRB): REPAIR COMPLEX LACERATION  TO THUMB, repair ulnar collateral ligament, repair extensor tendon , joint washout (Right)  Patient Location: PACU  Anesthesia Type: General  Level of Consciousness: awake and alert   Airway and Oxygen Therapy: Patient Spontanous Breathing  Post-op Pain: mild  Post-op Assessment: Post-op Vital signs reviewed, Patient's Cardiovascular Status Stable, Respiratory Function Stable, Patent Airway and No signs of Nausea or vomiting  Last Vitals:  Filed Vitals:   07/12/14 0130  BP: 121/69  Pulse: 61  Temp: 36.7 C  Resp: 16    Post-op Vital Signs: stable   Complications: No apparent anesthesia complications

## 2014-07-14 ENCOUNTER — Encounter (HOSPITAL_COMMUNITY): Payer: Self-pay | Admitting: General Surgery

## 2014-09-04 ENCOUNTER — Ambulatory Visit: Payer: Worker's Compensation | Attending: General Surgery | Admitting: Occupational Therapy

## 2014-09-15 ENCOUNTER — Ambulatory Visit: Payer: Worker's Compensation | Attending: General Surgery | Admitting: Occupational Therapy

## 2014-09-15 DIAGNOSIS — M6281 Muscle weakness (generalized): Secondary | ICD-10-CM

## 2014-09-15 DIAGNOSIS — M25641 Stiffness of right hand, not elsewhere classified: Secondary | ICD-10-CM | POA: Diagnosis present

## 2014-09-15 DIAGNOSIS — M79641 Pain in right hand: Secondary | ICD-10-CM | POA: Diagnosis present

## 2014-09-15 NOTE — Therapy (Signed)
[place] [facility] [address] [place], [address] Phone: [phone]   Fax:  [phone]  Occupational Therapy Treatment  Patient Details  Name: Kristen Dodson MRN: [id number] Date of Birth: September 24, 1990 Referring Provider:  Knute Neu, MD  Encounter Date: 09/15/2014      OT End of Session - 09/15/14 1350    Visit Number 1   Number of Visits 12   Date for OT Re-Evaluation 10/27/14   OT Start Time 1053   OT Stop Time 1148   OT Time Calculation (min) 55 min   Activity Tolerance Patient tolerated treatment well   Behavior During Therapy [facility] for tasks assessed/performed      Past Medical History  Diagnosis Date  . Blood disease     Past Surgical History  Procedure Laterality Date  . I&d extremity Right 07/11/2014    Procedure: REPAIR COMPLEX LACERATION  TO THUMB, repair ulnar collateral ligament, repair extensor tendon , joint washout;  Surgeon: Knute Neu, MD;  Location: [facility] OR;  Service: Plastics;  Laterality: Right;    There were no vitals filed for this visit.  Visit Diagnosis:  Pain of right hand  Stiffness of finger joint of right hand  Muscle weakness      Subjective Assessment - 09/15/14 1340    Subjective  Had surgery 07/11/14 - but increase pain and thumb shake and cannot use it    Patient Stated Goals It is my R hand , want the use of my thumb back and no pain             [facility] OT Assessment - 09/15/14 0001    Assessment   Diagnosis R thumb DIPJ ext tendon and collateral ligament repair   Onset Date 07/11/14   Assessment Pt present close to 9 wks postop repair - still wear splint for night time and after work - she did not had any therapy this far per pt -  she cannot use her thumb - shakes , hurts , cannot grip , lift , pull or hold objects    Prior Function   Level of Independence Independent   Vocation Other (comment)  works at [employer] and did [employer] prior    Leisure likes to play  with her kids 8 and 3 yrs old, nephew 94 months old, play basketball with son, yard work, house work  and worked 2 jobs prior to onset   Sensation   Additional Comments Report some numbness on ulnar side of thumb DIP distal to scar   AROM   Right Wrist Extension 60 Degrees   Right Wrist Flexion 62 Degrees   Left Wrist Extension 65 Degrees   Left Wrist Flexion 90 Degrees   Right Hand AROM   R Thumb MCP 0-60 60 Degrees   R Thumb IP 0-80 47 Degrees  -30 ext   R Thumb Radial ABduction/ADduction 0-55 58   R Thumb Palmar ABduction/ADduction 0-45 48   R Thumb Opposition to Index --  Opposition to distal fold of 5th    Left Hand AROM   L Thumb MCP 0-60 75 Degrees   L Thumb IP 0-80 70 Degrees  hyper extention 40   L Thumb Radial ADduction/ABduction 0-55 60   L Thumb Palmar ADduction/ABduction 0-45 60                  OT Treatments/Exercises (OP) - 09/15/14 0001    RUE Fluidotherapy   Number Minutes Fluidotherapy  12 Minutes   RUE Fluidotherapy Location Hand   Comments AROM and desenstitization to decrease pain adn increase ROM                 OT Education - 09/15/14 1350    Education provided Yes   Education Details HEP    Person(s) Educated Patient   Methods Explanation;Demonstration;Tactile cues;Verbal cues;Handout   Comprehension Verbalized understanding;Returned demonstration;Verbal cues required;Tactile cues required          OT Short Term Goals - 09/15/14 1355    OT SHORT TERM GOAL #1   Title 1.Pt. will have increased thumb AROM at IP extention and flexion by 20 degrees to do buttons    Baseline IP flexion 30 to 47 -  L hyper extention 40 to flexion 70    Time 4   Period Weeks   Status New   OT SHORT TERM GOAL #2   Title R thumb PA and RA as well as opposition to [facility] to hold glass    Baseline PA 48  RA 58 - MP 60    Time 2   Period Weeks   Status New   OT SHORT TERM GOAL #3   Title Wrist AROM  WNL  to increase use with pushing up and  dressing/bathing    Baseline Wrist felxion 62, L 90; ext 60 , L 65    Time 2   Period Weeks   Status New           OT Long Term Goals - 09/15/14 1358    OT LONG TERM GOAL #1   Title Pt pain on PRWHE improve by at least 20 points with using desentitization tech and scar massage    Baseline Cannot tolerate massage and any texture - touching scar - pain 43/50 function    Time 3   Period Weeks   Status New   OT LONG TERM GOAL #2   Title Grip improve to at least 50% compare to L hand to play with kids and some at work    Baseline NT    Time 6   Period Weeks   Status New   OT LONG TERM GOAL #3   Title Lat and 3 point grip improve to 75% compare to L hand to hold plate and function improve by 20 points on PRWHE    Baseline Function pRWHE 45/50   Time 6   Period Weeks   Status New               Plan - 09/15/14 1351    Clinical Impression Statement Pt present about 9 wks postop R thumb DIPJ tendon and collateral ligament repair - pt present with hyper sensitivty to scar and pain - cannot tolerate any massage or texture - decrease thumb IP extention , flexion  more than PA and RA as well as MP flexion - some  numnbness on ulnar side of IP distal to scar - decrease functional use -  and strength - pt can benefit from OT/hand therapy servicies    Pt will benefit from skilled therapeutic intervention in order to improve on the following deficits (Retired) Decreased knowledge of precautions;Decreased coordination;Decreased range of motion;Decreased scar mobility;Decreased strength;Impaired flexibility;Impaired sensation;Impaired UE functional use;Pain   Rehab Potential Good   OT Frequency 2x / week   OT Duration 6 weeks   OT Treatment/Interventions Self-care/ADL training;Fluidtherapy;Parrafin;Ultrasound;Therapeutic exercise;Passive range of motion;Scar mobilization;Manual Therapy;Splinting;Patient/family education   Plan assess pain and tolernce of HEP - IP  extention splint - pt to  bring her splint in to assess   OT Home Exercise Plan see pt instruction   Consulted and Agree with Plan of Care Patient        Problem List There are no active problems to display for this patient.   Rush Salce OTR/, CLT  09/15/2014, 2:06 PM  [place] [facility] [address] [place], [address] Phone: [phone]   Fax:  [phone]

## 2014-09-15 NOTE — Patient Instructions (Signed)
AROM of PA and RA of thumb  AROM of IP flexion and place and hold IP extention  1:2  Opposition and slide down 5th   Heat prior   Scar massage and desensitization  Digi sleeve for wear to decrease scar

## 2014-09-18 ENCOUNTER — Ambulatory Visit: Payer: Worker's Compensation | Admitting: Occupational Therapy

## 2014-09-22 ENCOUNTER — Ambulatory Visit: Payer: Worker's Compensation | Admitting: Occupational Therapy

## 2014-09-25 ENCOUNTER — Ambulatory Visit: Payer: Worker's Compensation | Admitting: Occupational Therapy

## 2014-10-01 ENCOUNTER — Ambulatory Visit: Payer: Worker's Compensation | Admitting: Occupational Therapy

## 2014-10-01 DIAGNOSIS — M6281 Muscle weakness (generalized): Secondary | ICD-10-CM

## 2014-10-01 DIAGNOSIS — M79641 Pain in right hand: Secondary | ICD-10-CM

## 2014-10-01 DIAGNOSIS — M25641 Stiffness of right hand, not elsewhere classified: Secondary | ICD-10-CM

## 2014-10-01 NOTE — Therapy (Signed)
[place] [facility] [address], [place], [address] Phone: [phone]   Fax:  [phone]  Occupational Therapy Treatment  Patient Details  Name: LYDIAH PONG MRN: [id number] Date of Birth: Aug 31, 1990 Referring Provider:  Knute Neu, MD  Encounter Date: 10/01/2014      OT End of Session - 10/01/14 1727    Visit Number 2   Number of Visits 12   Date for OT Re-Evaluation 10/27/14   OT Start Time 1616   OT Stop Time 1700   OT Time Calculation (min) 44 min   Activity Tolerance Patient tolerated treatment well   Behavior During Therapy [facility] for tasks assessed/performed      Past Medical History  Diagnosis Date  . Blood disease     Past Surgical History  Procedure Laterality Date  . I&d extremity Right 07/11/2014    Procedure: REPAIR COMPLEX LACERATION  TO THUMB, repair ulnar collateral ligament, repair extensor tendon , joint washout;  Surgeon: Knute Neu, MD;  Location: [facility] OR;  Service: Plastics;  Laterality: Right;    There were no vitals filed for this visit.  Visit Diagnosis:  Pain of right hand  Stiffness of finger joint of right hand  Muscle weakness      Subjective Assessment - 10/01/14 1720    Subjective  Little bit better - moving  and tenderness over scar - I can tolerate people touching it now - still pain on the side and wearing soft splint at night time and at work    Patient Stated Goals It is my R hand , want the use of my thumb back and no pain    Currently in Pain? Yes   Pain Score 8    Pain Location Finger (Comment which one)   Pain Orientation Right   Pain Descriptors / Indicators Aching   Pain Onset More than a month ago            [facility] OT Assessment - 10/01/14 0001    Right Hand AROM   R Thumb IP 0-80 48 Degrees  -25   R Thumb Radial ABduction/ADduction 0-55 58   R Thumb Palmar ABduction/ADduction 0-45 48   R Thumb Opposition to Index --  Opposition to 2nd  fold of 5th                   OT Treatments/Exercises (OP) - 10/01/14 0001    Hand Exercises   Other Hand Exercises PROM and prolonged  extention of IP thumb, and PROM of flexion IP , blocked AROM IP , Opposition sliding down 4th and 5th digits    Other Hand Exercises Light blue putty for  thumb lat grip - 3 point grip , PA and RA of thumb add to HEP    RUE Fluidotherapy   Number Minutes Fluidotherapy 12 Minutes   RUE Fluidotherapy Location Hand   Comments AROM prior to manual and ther ex to increase ROM and decrease pain    Splinting   Splinting pt to use no splint at night time - showed how to use tape or bandaid to pull thumb IP into extention  after work for few times 5 min at time    Manual Therapy   Manual therapy comments Scar massage and mobs done - pt ed on and silicon sleeve provided for night time use - and cont with desentitization  OT Education - 10/01/14 1727    Education provided Yes   Education Details HEP   Person(s) Educated Patient   Methods Explanation;Demonstration;Tactile cues;Handout;Verbal cues   Comprehension Verbal cues required;Returned demonstration;Verbalized understanding          OT Short Term Goals - 09/15/14 1355    OT SHORT TERM GOAL #1   Title 1.Pt. will have increased thumb AROM at IP extention and flexion by 20 degrees to do buttons    Baseline IP flexion 30 to 47 -  L hyper extention 40 to flexion 70    Time 4   Period Weeks   Status New   OT SHORT TERM GOAL #2   Title R thumb PA and RA as well as opposition to [facility] to hold glass    Baseline PA 48  RA 58 - MP 60    Time 2   Period Weeks   Status New   OT SHORT TERM GOAL #3   Title Wrist AROM  WNL  to increase use with pushing up and dressing/bathing    Baseline Wrist felxion 62, L 90; ext 60 , L 65    Time 2   Period Weeks   Status New           OT Long Term Goals - 09/15/14 1358    OT LONG TERM GOAL #1   Title Pt pain on PRWHE improve by at  least 20 points with using desentitization tech and scar massage    Baseline Cannot tolerate massage and any texture - touching scar - pain 43/50 function    Time 3   Period Weeks   Status New   OT LONG TERM GOAL #2   Title Grip improve to at least 50% compare to L hand to play with kids and some at work    Baseline NT    Time 6   Period Weeks   Status New   OT LONG TERM GOAL #3   Title Lat and 3 point grip improve to 75% compare to L hand to hold plate and function improve by 20 points on PRWHE    Baseline Function pRWHE 45/50   Time 6   Period Weeks   Status New               Plan - 10/01/14 1729    Clinical Impression Statement Pt cont to show increase pain , and decrease thumb IP flexion and extention - scar tissue still tight  and tender - but did improve - pt to focus on IP of thumb and light putty provided for thumb in all planes - encourage to use heat for pain and flexibiilty    Pt will benefit from skilled therapeutic intervention in order to improve on the following deficits (Retired) Decreased knowledge of precautions;Decreased coordination;Decreased range of motion;Decreased scar mobility;Decreased strength;Impaired flexibility;Impaired sensation;Impaired UE functional use;Pain   Rehab Potential Good   OT Frequency 2x / week   OT Duration 4 weeks   OT Treatment/Interventions Self-care/ADL training;Fluidtherapy;Parrafin;Ultrasound;Therapeutic exercise;Passive range of motion;Scar mobilization;Manual Therapy;Splinting;Patient/family education   Plan assess pain , HEP , if not improve with tape on extention for thumb - will make little gutter splint    OT Home Exercise Plan see pt instruction   Consulted and Agree with Plan of Care Patient        Problem List There are no active problems to display for this patient.   Oletta Cohn OTR/L,CLT 10/01/2014, 5:32 PM  [place] [facility] [address] [place], [address] Phone: [phone]   Fax:  [phone]

## 2014-10-01 NOTE — Patient Instructions (Signed)
Cont with thumb IP flexion PROM , and extention  Blocked AROM IP flexion , ext  Tape few times during day for 5 min IP thumb extention   Light blue putty for 3 point , lat, PA and RA   Scar massage  Digi sleeve for night time

## 2014-10-03 ENCOUNTER — Ambulatory Visit: Payer: Worker's Compensation | Admitting: Occupational Therapy

## 2015-03-02 ENCOUNTER — Emergency Department
Admission: EM | Admit: 2015-03-02 | Discharge: 2015-03-02 | Disposition: A | Discharge status: Home / Self Care | Payer: Self-pay | Attending: Emergency Medicine | Admitting: Emergency Medicine

## 2015-03-02 DIAGNOSIS — F17200 Nicotine dependence, unspecified, uncomplicated: Secondary | ICD-10-CM | POA: Insufficient documentation

## 2015-03-02 DIAGNOSIS — Z3202 Encounter for pregnancy test, result negative: Secondary | ICD-10-CM | POA: Insufficient documentation

## 2015-03-02 DIAGNOSIS — N3001 Acute cystitis with hematuria: Secondary | ICD-10-CM | POA: Insufficient documentation

## 2015-03-02 LAB — CBC
HEMATOCRIT: 33.2 % — AB (ref 35.0–47.0)
Hemoglobin: 12 g/dL (ref 12.0–16.0)
MCH: 34.8 pg — AB (ref 26.0–34.0)
MCHC: 36 g/dL (ref 32.0–36.0)
MCV: 96.8 fL (ref 80.0–100.0)
Platelets: 284 10*3/uL (ref 150–440)
RBC: 3.44 MIL/uL — AB (ref 3.80–5.20)
RDW: 17.2 % — ABNORMAL HIGH (ref 11.5–14.5)
WBC: 10.9 10*3/uL (ref 3.6–11.0)

## 2015-03-02 LAB — COMPREHENSIVE METABOLIC PANEL
ALT: 11 U/L — ABNORMAL LOW (ref 14–54)
AST: 12 U/L — AB (ref 15–41)
Albumin: 4.4 g/dL (ref 3.5–5.0)
Alkaline Phosphatase: 45 U/L (ref 38–126)
Anion gap: 5 (ref 5–15)
BUN: 6 mg/dL (ref 6–20)
CO2: 27 mmol/L (ref 22–32)
Calcium: 9 mg/dL (ref 8.9–10.3)
Chloride: 109 mmol/L (ref 101–111)
Creatinine, Ser: 0.73 mg/dL (ref 0.44–1.00)
GFR calc Af Amer: >60 mL/min (ref >60)
GFR calc non Af Amer: >60 mL/min (ref >60)
Glucose, Bld: 103 mg/dL — ABNORMAL HIGH (ref 65–99)
POTASSIUM: 3.8 mmol/L (ref 3.5–5.1)
SODIUM: 141 mmol/L (ref 135–145)
Total Bilirubin: 1.4 mg/dL — ABNORMAL HIGH (ref 0.3–1.2)
Total Protein: 7.8 g/dL (ref 6.5–8.1)

## 2015-03-02 LAB — URINALYSIS COMPLETE WITH MICROSCOPIC (ARMC ONLY)
Bilirubin Urine: NEGATIVE
Glucose, UA: NEGATIVE mg/dL
Nitrite: NEGATIVE
PH: 5 (ref 5.0–8.0)
PROTEIN: 30 mg/dL — AB
Specific Gravity, Urine: 1.023 (ref 1.005–1.030)

## 2015-03-02 LAB — LIPASE, BLOOD: LIPASE: 16 U/L (ref 11–51)

## 2015-03-02 LAB — POCT PREGNANCY, URINE: Preg Test, Ur: NEGATIVE

## 2015-03-02 MED ORDER — SULFAMETHOXAZOLE-TRIMETHOPRIM 800-160 MG PO TABS: 1.0000 | ORAL_TABLET | Freq: Two times a day (BID) | ORAL | Status: AC

## 2015-03-02 MED ORDER — PHENAZOPYRIDINE HCL 200 MG PO TABS: 200.0000 mg | ORAL_TABLET | Freq: Three times a day (TID) | ORAL | Status: AC | PRN

## 2015-03-02 NOTE — ED Notes (Signed)
Pt reports lower back pain and lower abdominal pain for the past week and a half. Pt reports started when her cycle did but her cycle has stopped and the pain still there. Pt denies urinary sx's and vaginal discharge.

## 2015-03-02 NOTE — ED Notes (Signed)
Ron, PA agreed to see pt.
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
## 2015-03-02 NOTE — Discharge Instructions (Signed)

## 2015-03-30 ENCOUNTER — Ambulatory Visit
Admission: RE | Admit: 2015-03-30 | Discharge: 2015-03-30 | Disposition: A | Discharge status: Home / Self Care | Payer: PRIVATE HEALTH INSURANCE | Source: Ambulatory Visit | Attending: Family Medicine | Admitting: Family Medicine

## 2015-03-30 ENCOUNTER — Other Ambulatory Visit: Payer: Self-pay | Admitting: Family Medicine

## 2015-03-30 DIAGNOSIS — R7611 Nonspecific reaction to tuberculin skin test without active tuberculosis: Secondary | ICD-10-CM | POA: Insufficient documentation

## 2018-07-03 ENCOUNTER — Emergency Department: Payer: No Typology Code available for payment source

## 2018-07-03 ENCOUNTER — Emergency Department
Admission: EM | Admit: 2018-07-03 | Discharge: 2018-07-03 | Disposition: A | Discharge status: Home / Self Care | Payer: No Typology Code available for payment source | Attending: Emergency Medicine | Admitting: Emergency Medicine

## 2018-07-03 ENCOUNTER — Other Ambulatory Visit: Payer: Self-pay

## 2018-07-03 DIAGNOSIS — S0081XA Abrasion of other part of head, initial encounter: Secondary | ICD-10-CM | POA: Insufficient documentation

## 2018-07-03 DIAGNOSIS — Y92410 Unspecified street and highway as the place of occurrence of the external cause: Secondary | ICD-10-CM | POA: Diagnosis not present

## 2018-07-03 DIAGNOSIS — S4992XA Unspecified injury of left shoulder and upper arm, initial encounter: Secondary | ICD-10-CM | POA: Diagnosis present

## 2018-07-03 DIAGNOSIS — R0789 Other chest pain: Secondary | ICD-10-CM | POA: Diagnosis not present

## 2018-07-03 DIAGNOSIS — Y998 Other external cause status: Secondary | ICD-10-CM | POA: Insufficient documentation

## 2018-07-03 DIAGNOSIS — M7918 Myalgia, other site: Secondary | ICD-10-CM

## 2018-07-03 DIAGNOSIS — S42255A Nondisplaced fracture of greater tuberosity of left humerus, initial encounter for closed fracture: Secondary | ICD-10-CM | POA: Insufficient documentation

## 2018-07-03 DIAGNOSIS — Y9301 Activity, walking, marching and hiking: Secondary | ICD-10-CM | POA: Diagnosis not present

## 2018-07-03 DIAGNOSIS — F17210 Nicotine dependence, cigarettes, uncomplicated: Secondary | ICD-10-CM | POA: Diagnosis not present

## 2018-07-03 DIAGNOSIS — R51 Headache: Secondary | ICD-10-CM | POA: Insufficient documentation

## 2018-07-03 DIAGNOSIS — R1012 Left upper quadrant pain: Secondary | ICD-10-CM | POA: Insufficient documentation

## 2018-07-03 LAB — CBC WITH DIFFERENTIAL/PLATELET
Abs Immature Granulocytes: 0.06 10*3/uL (ref 0.00–0.07)
Basophils Absolute: 0 10*3/uL (ref 0.0–0.1)
Basophils Relative: 1 %
Eosinophils Absolute: 0.1 10*3/uL (ref 0.0–0.5)
Eosinophils Relative: 2 %
HCT: 33.9 % — ABNORMAL LOW (ref 36.0–46.0)
Hemoglobin: 12.2 g/dL (ref 12.0–15.0)
Immature Granulocytes: 1 %
Lymphocytes Relative: 33 %
Lymphs Abs: 2 10*3/uL (ref 0.7–4.0)
MCH: 35.2 pg — ABNORMAL HIGH (ref 26.0–34.0)
MCHC: 36 g/dL (ref 30.0–36.0)
MCV: 97.7 fL (ref 80.0–100.0)
Monocytes Absolute: 0.4 10*3/uL (ref 0.1–1.0)
Monocytes Relative: 7 %
Neutro Abs: 3.5 10*3/uL (ref 1.7–7.7)
Neutrophils Relative %: 56 %
Platelets: 268 10*3/uL (ref 150–400)
RBC: 3.47 MIL/uL — ABNORMAL LOW (ref 3.87–5.11)
RDW: 18.6 % — ABNORMAL HIGH (ref 11.5–15.5)
WBC: 6.2 10*3/uL (ref 4.0–10.5)
nRBC: 0.3 % — ABNORMAL HIGH (ref 0.0–0.2)

## 2018-07-03 LAB — BASIC METABOLIC PANEL
Anion gap: 10 (ref 5–15)
BUN: 8 mg/dL (ref 6–20)
CO2: 23 mmol/L (ref 22–32)
Calcium: 8.3 mg/dL — ABNORMAL LOW (ref 8.9–10.3)
Chloride: 112 mmol/L — ABNORMAL HIGH (ref 98–111)
Creatinine, Ser: 0.75 mg/dL (ref 0.44–1.00)
GFR calc Af Amer: >60 mL/min (ref >60)
GFR calc non Af Amer: >60 mL/min (ref >60)
Glucose, Bld: 118 mg/dL — ABNORMAL HIGH (ref 70–99)
Potassium: 3.5 mmol/L (ref 3.5–5.1)
Sodium: 145 mmol/L (ref 135–145)

## 2018-07-03 MED ORDER — HYDROCODONE-ACETAMINOPHEN 5-325 MG PO TABS: 1.0000 | ORAL_TABLET | Freq: Four times a day (QID) | ORAL | 0 refills | Status: AC | PRN

## 2018-07-03 MED ORDER — ACETAMINOPHEN 500 MG PO TABS
1000.0000 mg | ORAL_TABLET | Freq: Once | ORAL | Status: DC
  Filled 2018-07-03: qty 2

## 2018-07-03 MED ORDER — IOHEXOL 300 MG/ML  SOLN
100.0000 mL | Freq: Once | INTRAMUSCULAR | Status: AC | PRN
  Administered 2018-07-03: 06:00:00 100 mL via INTRAVENOUS

## 2018-07-03 MED ORDER — HYDROCODONE-ACETAMINOPHEN 5-325 MG PO TABS
1.0000 | ORAL_TABLET | Freq: Once | ORAL | Status: AC
  Administered 2018-07-03: 1 via ORAL
  Filled 2018-07-03: qty 1

## 2018-07-03 NOTE — ED Notes (Signed)
Sling applied per order. Pt states it feels more supported.

## 2018-07-03 NOTE — ED Notes (Signed)
Pt's father Isac Sarna called and stated that pt's grandfather will come to pick patient up unable to state time.

## 2018-07-03 NOTE — Discharge Instructions (Signed)
Please seek medical attention for any high fevers, chest pain, shortness of breath, change in behavior, persistent vomiting, bloody stool or any other new or concerning symptoms.  
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
## 2018-07-03 NOTE — ED Notes (Addendum)
Attempted to call pts friend, listed as emergency contact per request of pt. Pts friend did not answer at this time.
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
## 2018-07-03 NOTE — ED Notes (Signed)
Patient transported to CT 

## 2018-07-03 NOTE — ED Notes (Signed)
Pt. Keeps removing monitors, states pain is unchanged after pain meds. Family and friends called several times, ride not found at this time. Pt. Admits to ETOH use earlier today.

## 2018-07-03 NOTE — ED Notes (Signed)
Pt sitting up in bed fumbling around. Pt waved this nurse in. Pt c/o pain on left side. MD notified orders for 1g of tylenol at this time
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
## 2022-01-29 ENCOUNTER — Emergency Department
Admission: EM | Admit: 2022-01-29 | Discharge: 2022-01-29 | Disposition: A | Discharge status: Home / Self Care | Payer: Self-pay | Attending: Emergency Medicine | Admitting: Emergency Medicine

## 2022-01-29 ENCOUNTER — Emergency Department: Payer: Self-pay

## 2022-01-29 ENCOUNTER — Other Ambulatory Visit: Payer: Self-pay

## 2022-01-29 DIAGNOSIS — S0181XA Laceration without foreign body of other part of head, initial encounter: Secondary | ICD-10-CM | POA: Insufficient documentation

## 2022-01-29 DIAGNOSIS — W109XXA Fall (on) (from) unspecified stairs and steps, initial encounter: Secondary | ICD-10-CM | POA: Insufficient documentation

## 2022-01-29 DIAGNOSIS — Z23 Encounter for immunization: Secondary | ICD-10-CM | POA: Insufficient documentation

## 2022-01-29 MED ORDER — ACETAMINOPHEN 325 MG PO TABS
650.0000 mg | ORAL_TABLET | Freq: Once | ORAL | Status: AC
  Administered 2022-01-29: 650 mg via ORAL
  Filled 2022-01-29: qty 2

## 2022-01-29 MED ORDER — LIDOCAINE-EPINEPHRINE 2 %-1:100000 IJ SOLN
20.0000 mL | Freq: Once | INTRAMUSCULAR | Status: AC
  Administered 2022-01-29: 20 mL
  Filled 2022-01-29: qty 1

## 2022-01-29 MED ORDER — CEPHALEXIN 500 MG PO CAPS: 500.0000 mg | ORAL_CAPSULE | Freq: Four times a day (QID) | ORAL | 0 refills | Status: AC

## 2022-01-29 MED ORDER — LIDOCAINE HCL (PF) 1 % IJ SOLN
INTRAMUSCULAR | Status: AC
  Filled 2022-01-29: qty 5

## 2022-01-29 MED ORDER — TETANUS-DIPHTH-ACELL PERTUSSIS 5-2.5-18.5 LF-MCG/0.5 IM SUSY
0.5000 mL | PREFILLED_SYRINGE | Freq: Once | INTRAMUSCULAR | Status: AC
  Administered 2022-01-29: 0.5 mL via INTRAMUSCULAR
  Filled 2022-01-29: qty 0.5

## 2022-01-29 NOTE — ED Provider Triage Note (Signed)
Emergency Medicine Provider Triage Evaluation Note  LYGIA OLAES , a 31 y.o. female  was evaluated in triage.  Pt complains of fall down stairs, scalp lac, LOC. Missed a step and fell down a flight of stairs. LOC. No subsequent loc. HA, no other MSK pain.  Review of Systems  Positive: Scalp lac, head injury, LOC Negative: Neck pain, MSK pain, emesis  Physical Exam  There were no vitals taken for this visit. Gen:   Awake, no distress   Resp:  Normal effort  MSK:   Moves extremities without difficulty  Other:    Medical Decision Making  Medically screening exam initiated at 5:03 PM.  Appropriate orders placed.  GUY TONEY was informed that the remainder of the evaluation will be completed by another provider, this initial triage assessment does not replace that evaluation, and the importance of remaining in the ED until their evaluation is complete.  CT head/neck, tdap, repair in room   Racheal Patches, PA-C 01/29/22 1704

## 2022-01-29 NOTE — ED Triage Notes (Signed)
Pt presents via POV c/o laceration to forehead. Reports LOC after falling and hitting head. Reports headache.

## 2022-01-29 NOTE — ED Provider Notes (Signed)
[facility] Provider Note  Patient Contact: 7:10 PM (approximate)   History   Laceration   HPI  Kristen Dodson is a 31 y.o. female presents to the emergency department with a 4 cm laceration along the right forehead after patient states that she tripped against a step and fell.  Patient fell down approximately 5 steps.  She does have a mild headache.  No nausea or vomiting.  No numbness or tingling in the upper and lower extremities.  No chest pain or abdominal pain.  She needs a tetanus shot.      Physical Exam   Triage Vital Signs: ED Triage Vitals [01/29/22 1705]  Enc Vitals Group     BP 134/81     Pulse Rate 79     Resp 16     Temp 98.2 F (36.8 C)     Temp Source Oral     SpO2 100 %     Weight      Height      Head Circumference      Peak Flow      Pain Score 9     Pain Loc      Pain Edu?      Excl. in GC?     Most recent vital signs: Vitals:   01/29/22 1705  BP: 134/81  Pulse: 79  Resp: 16  Temp: 98.2 F (36.8 C)  SpO2: 100%     General: Alert and in no acute distress. Eyes:  PERRL. EOMI. Head: No acute traumatic findings ENT:      Nose: No congestion/rhinnorhea.      Mouth/Throat: Mucous membranes are moist. Neck: No stridor. No cervical spine tenderness to palpation. Cardiovascular:  Good peripheral perfusion Respiratory: Normal respiratory effort without tachypnea or retractions. Lungs CTAB. Good air entry to the bases with no decreased or absent breath sounds. Gastrointestinal: Bowel sounds 4 quadrants. Soft and nontender to palpation. No guarding or rigidity. No palpable masses. No distention. No CVA tenderness. Musculoskeletal: Full range of motion to all extremities.  Neurologic:  No gross focal neurologic deficits are appreciated.  Skin: Patient has a 4 cm laceration at right forehead.    ED Results / Procedures / Treatments   Labs (all labs ordered are listed, but only abnormal results are  displayed) Labs Reviewed - No data to display      PROCEDURES:  Critical Care performed: No  ..Laceration Repair  Date/Time: 01/29/2022 7:12 PM  Performed by: Orvil Feil, PA-C Authorized by: Orvil Feil, PA-C   Consent:    Consent obtained:  Verbal   Risks discussed:  Infection and pain Universal protocol:    Procedure explained and questions answered to patient or proxy's satisfaction: yes     Patient identity confirmed:  Verbally with patient Anesthesia:    Anesthesia method:  Local infiltration   Local anesthetic:  Lidocaine 1% w/o epi Laceration details:    Location:  Face   Face location:  Forehead   Length (cm):  4 Pre-procedure details:    Preparation:  Patient was prepped and draped in usual sterile fashion Exploration:    Limited defect created (wound extended): no     Imaging outcome: foreign body not noted     Contaminated: no   Treatment:    Area cleansed with:  Povidone-iodine   Amount of cleaning:  Standard   Irrigation solution:  Sterile saline   Debridement:  None Skin repair:    Repair method:  Sutures   Suture size:  5-0   Suture material:  Fast-absorbing gut   Suture technique:  Running locked   Number of sutures:  10 Approximation:    Approximation:  Close Repair type:    Repair type:  Intermediate Post-procedure details:    Dressing:  Open (no dressing)   Procedure completion:  Tolerated well, no immediate complications    MEDICATIONS ORDERED IN ED: Medications  lidocaine (PF) (XYLOCAINE) 1 % injection (has no administration in time range)  Tdap (BOOSTRIX) injection 0.5 mL (0.5 mLs Intramuscular Given 01/29/22 1812)  lidocaine-EPINEPHrine (XYLOCAINE W/EPI) 2 %-1:100000 (with pres) injection 20 mL (20 mLs Infiltration Given 01/29/22 1813)  acetaminophen (TYLENOL) tablet 650 mg (650 mg Oral Given 01/29/22 1812)     IMPRESSION / MDM / ASSESSMENT AND PLAN / ED COURSE  I reviewed the triage vital signs and the nursing  notes.                              Assessment and plan Facial laceration 31 year old female presents to the emergency department with a laceration along the right forehead after a fall.  Vital signs are reassuring at triage.  On exam, patient was alert, active and nontoxic-appearing.  CTs of the head and cervical spine showed no acute abnormality   Patient was advised to have external sutures removed in 7 days.  Her tetanus status was updated prior to discharge.  She was discharged with Keflex.   FINAL CLINICAL IMPRESSION(S) / ED DIAGNOSES   Final diagnoses:  Facial laceration, initial encounter     Rx / DC Orders   ED Discharge Orders          Ordered    cephALEXin (KEFLEX) 500 MG capsule  4 times daily        01/29/22 1830             Note:  This document was prepared using Dragon voice recognition software and may include unintentional dictation errors.   Pia Mau [place], PA-C 01/29/22 1913    Pilar Jarvis, MD 01/29/22 2216

## 2022-01-29 NOTE — Discharge Instructions (Signed)
Remove sutures in seven days. Take Keflex three times daily.
